# Patient Record
Sex: Female | Born: 2000 | Hispanic: Refuse to answer | Marital: Single | Smoking: Never smoker
Health system: Southern US, Community
[De-identification: ages and names within clinical notes are randomized; demographics above are authoritative.]

---
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
## 2019-01-01 ENCOUNTER — Ambulatory Visit (INDEPENDENT_AMBULATORY_CARE_PROVIDER_SITE_OTHER): Payer: BC Managed Care – PPO | Admitting: Family Medicine

## 2019-01-01 ENCOUNTER — Other Ambulatory Visit: Payer: Self-pay

## 2019-01-01 DIAGNOSIS — M25562 Pain in left knee: Secondary | ICD-10-CM | POA: Diagnosis not present

## 2019-01-02 LAB — URIC ACID: Uric Acid: 3.4 mg/dL (ref 2.5–7.1)

## 2019-01-02 LAB — SEDIMENTATION RATE: Sed Rate: 16 mm/hr (ref 0–32)

## 2019-01-02 LAB — RHEUMATOID FACTOR: Rheumatoid fact SerPl-aCnc: 21.4 IU/mL — ABNORMAL HIGH (ref 0.0–13.9)

## 2019-01-06 ENCOUNTER — Other Ambulatory Visit: Payer: Self-pay | Admitting: Family Medicine

## 2019-01-06 DIAGNOSIS — M25562 Pain in left knee: Secondary | ICD-10-CM

## 2019-01-14 NOTE — Progress Notes (Signed)
Patient presents for follow-up for left knee pain. She states her knee pain began after catching during softball practice. Patient has had a Xray and MRI. Reviewed results for both with patient. Naprosyn has seemed to help some. Her ROM has improved some since initial visit. Denies any new symptoms such as fever, rash, or other joint pain.   ROS: Negative except mentioned above. Vitals as per Epic.  GENERAL: NAD MSK:L Knee - no significant effusion, no warmth or erythema appreciated, moderate tenderness to palpation of the knee (general), has anterior discomfort of the knee with any manipulation, nv intact  NEURO: CN II-XII grossly intact   A/P: L Knee Pain - hypersensitive to touch, unsure if symptoms related to medial patella plica or Hoffa's fat pad noted on MRI, will do some labs, f/u with Dr. Gerald Dexter when he is on campus, continue Naprosyn for now, seek medical attention if any change in symptoms or worsening symptoms. Patient and trainer addresses understanding.    Addendum - nl uric acid and ESR but slightly elevated RF, will refer to Rheumatology.

## 2019-01-29 NOTE — Progress Notes (Signed)
Office Visit Note  Patient: Katie Jacobs             Date of Birth: 06/22/2000           MRN: [id number]             PCP: Katie ProvostPatel, Kirtida, MD Referring: Katie ProvostPatel, Kirtida, MD Visit Date: 02/12/2019 Occupation: [name], [name]  Subjective:  Pain in left knee.   History of Present Illness: Katie Jacobs is a 18 y.o. female seen in consultation per request of Dr. Allena Jacobs.  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  She states on December 14, 2018 she was placed in the catcher position which is unusual for her.  She states after the game her left knee started hurting.  She went to the trainer who initially thought it was IT band issue.  Next week she states that after sprinting she started having severe pain in her knee joint to the point she could not run anymore.  She went to her PCP at [facility] [employer] [employer] who placed her on Naprosyn.  Her knee joint was also very sensitive to touch.  At the time some labs were obtained and her rheumatoid factor was positive.  She was seen by sports medicine doctor who did MRI which according to patient was normal.  She was placed on Indocin for pain management.  [facility] who felt that her plica was thickened and advised to rest.  She was also given a cortisone shot about 4 weeks ago.  She states that gradually her symptoms are started improving.  She had use crutches for 2 months now.  She has been off crutches for 5 days now.  She is a still unable to walk long distance.  She states at the time no arthroscopic surgery was advised.  She was also diagnosed with complex regional pain syndrome and was placed on Elavil which has been helping her to some extent.  None of the other joints are painful.  She has no family history of autoimmune disease.  Activities of Daily Living:  Patient reports morning stiffness for 0 minutes.   Patient Denies nocturnal pain.  Difficulty dressing/grooming: Denies Difficulty climbing  stairs: Reports Difficulty getting out of chair: Denies Difficulty using hands for taps, buttons, cutlery, and/or writing: Denies  Review of Systems  Constitutional: Negative for fatigue, night sweats, weight gain and weight loss.  HENT: Negative for mouth sores, trouble swallowing, trouble swallowing, mouth dryness and nose dryness.   Eyes: Negative for pain, redness, itching, visual disturbance and dryness.  Respiratory: Negative for cough, shortness of breath, wheezing and difficulty breathing.   Cardiovascular: Negative for chest pain, palpitations, hypertension, irregular heartbeat and swelling in legs/feet.  Gastrointestinal: Negative for abdominal pain, blood in stool, constipation and diarrhea.  Endocrine: Negative for increased urination.  Genitourinary: Negative for difficulty urinating, painful urination and vaginal dryness.  Musculoskeletal: Positive for gait problem. Negative for arthralgias, joint pain, joint swelling, myalgias, muscle weakness, morning stiffness, muscle tenderness and myalgias.  Skin: Negative for color change, rash, hair loss, skin tightness, ulcers and sensitivity to sunlight.  Allergic/Immunologic: Negative for susceptible to infections.  Neurological: Negative for dizziness, light-headedness, numbness, headaches, memory loss, night sweats and weakness.  Hematological: Negative for bruising/bleeding tendency and swollen glands.  Psychiatric/Behavioral: Negative for depressed mood, confusion and sleep disturbance. The patient is not nervous/anxious.     PMFS History:  There are no active problems to display for this  patient.   History reviewed. No pertinent past medical history.  Family History  Problem Relation Age of Onset  . Healthy Mother   . Healthy Father   . Healthy Brother    Past Surgical History:  Procedure Laterality Date  . WISDOM TOOTH EXTRACTION  2014   Social History   Social History Narrative  . Not on file    There is no  immunization history on file for this patient.   Objective: Vital Signs: BP 133/89 (BP Location: Right Arm, Patient Position: Sitting, Cuff Size: Normal)   Pulse 94   Resp 12   Ht 5\' 10"  (1.778 m)   Wt 176 lb 9.6 oz (80.1 kg)   BMI 25.34 kg/m    Physical Exam Vitals signs and nursing note reviewed.  Constitutional:      Appearance: She is well-developed.  HENT:     Head: Normocephalic and atraumatic.  Eyes:     Conjunctiva/sclera: Conjunctivae normal.  Neck:     Musculoskeletal: Normal range of motion.  Cardiovascular:     Rate and Rhythm: Normal rate and regular rhythm.     Heart sounds: Normal heart sounds.  Pulmonary:     Effort: Pulmonary effort is normal.     Breath sounds: Normal breath sounds.  Abdominal:     General: Bowel sounds are normal.     Palpations: Abdomen is soft.  Lymphadenopathy:     Cervical: No cervical adenopathy.  Skin:    General: Skin is warm and dry.     Capillary Refill: Capillary refill takes less than 2 seconds.  Neurological:     Mental Status: She is alert and oriented to person, place, and time.  Psychiatric:        Behavior: Behavior normal.      Musculoskeletal Exam: C-spine thoracic and lumbar spine were in good range of motion.  She had no SI joint tenderness penis.  Shoulder joints, elbow joints,  MCPs PIPs and DIPs with good range of motion with no synovitis.  She has limited range of motion of her right wrist joint with no tenderness on palpation.  She relates it to previous injury about 4 years ago.  Hip joints, knee joints, ankle joints, MTPs PIPs with good range of motion.  She had no warmth or swelling in her left knee joint today.  CDAI Exam: CDAI Score: - Patient Global: -; Provider Global: - Swollen: -; Tender: - Joint Exam   No joint exam has been documented for this visit   There is currently no information documented on the homunculus. Go to the Rheumatology activity and complete the homunculus joint exam.   Investigation: No additional findings. Component     Latest Ref Rng & Units 01/01/2019  Sed Rate     0 - 32 mm/hr 16  Uric Acid     2.5 - 7.1 mg/dL 3.4  RA Latex Turbid.     0.0 - 13.9 IU/mL 21.4 (H)   Imaging: No results found.  Recent Labs: No results found for: WBC, HGB, PLT, NA, K, CL, CO2, GLUCOSE, BUN, CREATININE, BILITOT, ALKPHOS, AST, ALT, PROT, ALBUMIN, CALCIUM, GFRAA, QFTBGOLD, QFTBGOLDPLUS  Speciality Comments: No specialty comments available.  Procedures:  No procedures performed Allergies: Patient has no known allergies.   Assessment / Plan:     Visit Diagnoses: Chronic pain of left knee -she has been having increased pain and discomfort in her left knee joint since the last softball game played on December 14, 2018 in the  catcher position at [employer].  She had extensive work-up including x-ray and MRI. XR unremarkable. MRI thickened medial patellar plica.  She has been evaluated by orthopedic surgeon and the sports medicine doctors.  Her labs initially showed elevated rheumatoid factor which is low titer.  None of the other joints are painful or swollen.  She had a cortisone injection to her left knee joint about 4 weeks ago with improvement in her symptoms.  Her symptoms are not completely resolved.  She still has discomfort and limps when she walks.  I have advised her to take anti-inflammatories on a regular basis for the next 2 months.  If she still has persistent symptoms then we may explore this further with obtaining more labs including anti-CCP and 14 3 3  eta.  At this point her symptoms are improving and I would like to observe.  There is no family history of autoimmune disease.  None of the other joints are painful.  She was in agreement.  01/01/19: sed rate 16, uric acid 3.4, RF 21.4  Complex regional pain syndrome-patient states she was having localized hypersensitivity to touch.  She was placed on Elavil which relieved her symptoms.  Wrist injury, right  sequela-patient reports that about 4 years ago she injured her right wrist while playing sports.  She has limited range of motion since then.  She had no warmth or swelling.  She states she has off-and-on discomfort in her wrist.  Orders: No orders of the defined types were placed in this encounter.  No orders of the defined types were placed in this encounter.     Follow-Up Instructions: Return in about 3 months (around 05/15/2019).   Bo Merino, MD  Note - This record has been created using Editor, commissioning.  Chart creation errors have been sought, but may not always  have been located. Such creation errors do not reflect on  the standard of medical care.

## 2019-02-12 ENCOUNTER — Encounter: Payer: Self-pay | Admitting: Rheumatology

## 2019-02-12 ENCOUNTER — Ambulatory Visit (INDEPENDENT_AMBULATORY_CARE_PROVIDER_SITE_OTHER): Payer: PRIVATE HEALTH INSURANCE | Admitting: Rheumatology

## 2019-02-12 ENCOUNTER — Other Ambulatory Visit: Payer: Self-pay

## 2019-02-12 VITALS — BP 133/89 | HR 94 | Resp 12 | Ht 70.0 in | Wt 176.6 lb

## 2019-02-12 DIAGNOSIS — G8929 Other chronic pain: Secondary | ICD-10-CM

## 2019-02-12 DIAGNOSIS — S6991XS Unspecified injury of right wrist, hand and finger(s), sequela: Secondary | ICD-10-CM

## 2019-02-12 DIAGNOSIS — M25562 Pain in left knee: Secondary | ICD-10-CM

## 2019-02-12 DIAGNOSIS — G90522 Complex regional pain syndrome I of left lower limb: Secondary | ICD-10-CM

## 2019-07-08 ENCOUNTER — Other Ambulatory Visit: Payer: Self-pay

## 2019-07-08 ENCOUNTER — Ambulatory Visit: Payer: BC Managed Care – PPO | Admitting: Family

## 2019-07-08 DIAGNOSIS — S61212A Laceration without foreign body of right middle finger without damage to nail, initial encounter: Secondary | ICD-10-CM

## 2019-07-08 DIAGNOSIS — Z4802 Encounter for removal of sutures: Secondary | ICD-10-CM

## 2019-07-08 NOTE — Progress Notes (Signed)
   Acute Office Visit  Subjective:    Patient ID: Katie Jacobs, female    DOB: September 14, 2000, 19 y.o.   MRN: [id number]  CC:  Suture removal  HPI Patient is in today for suture removal. She had 2 sutures placed to right 3rd mid finger 6 days ago after cutting hand on metal part of gate at softball practice.  Pt had sutures placed at [facility].  Pt was told her teatnus status was utd.  No past medical history on file.  No Known Allergies     Objective:    Physical Exam Skin:         There were no vitals taken for this visit.      Assessment & Plan:   Removed 2 sutures without difficulty.  Placed steri strips over with Benzoin.  Reviewed signs and symptoms of concerns to follow up for.  Pt will slowly return to play.   No orders of the defined types were placed in this encounter.    Colt Martelle, Deirdre Peer, NP

## 2020-08-30 IMAGING — MR MR KNEE*L* W/O CM
7 series · 40 of 40 positions shown · non-contrast
Comparison: None.

CLINICAL DATA: Decreased range of motion, popping. Status post
softball injury 2 weeks ago.

EXAM:
MRI OF THE LEFT KNEE WITHOUT CONTRAST
TECHNIQUE: Multiplanar, multisequence MR imaging of the knee was performed. No
intravenous contrast was administered.

[Series 8: T2 fat-sat · axial · left · 4.0mm · 0.50mm/px · z∈[-88,+35]mm · 6 of 26 slices shown (1 of 3)]
[im 1/26]
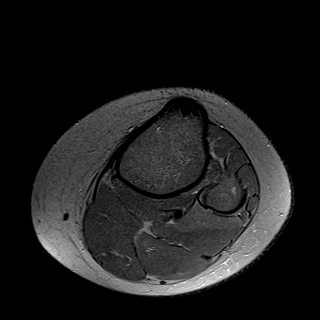
[im 6/26]
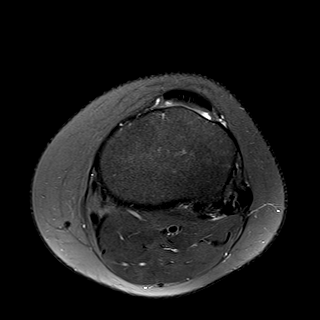
[im 11/26]
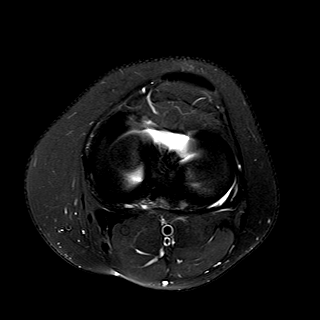
[im 16/26]
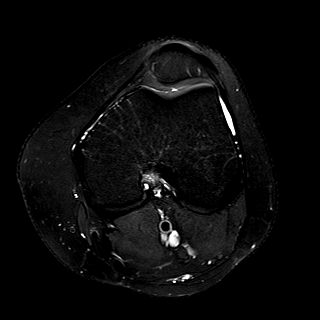
[im 21/26]
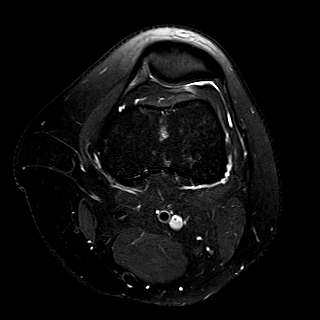
[im 26/26]
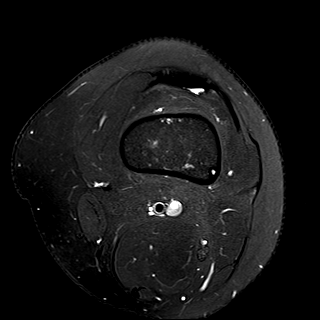

[Series 9: T2 fat-sat · coronal · left · 4.0mm · 0.59mm/px · 6 of 29 slices shown (2 of 3)]
[im 1/29]
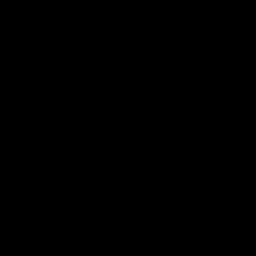
[im 6/29]
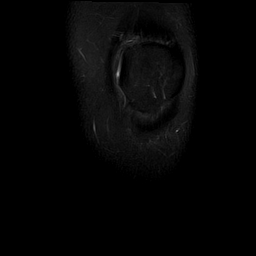
[im 12/29]
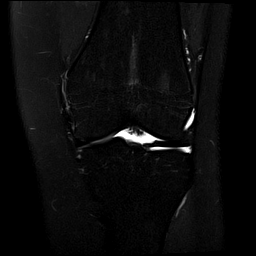
[im 17/29]
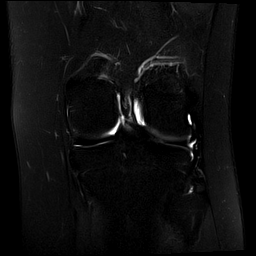
[im 23/29]
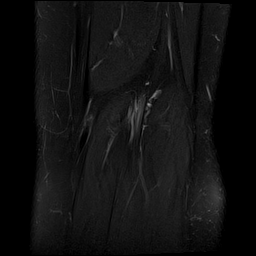
[im 29/29]
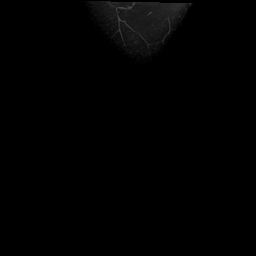

[Series 10: T1 · coronal · left · 4.0mm · 0.59mm/px · 6 of 30 slices shown]
[im 1/30]
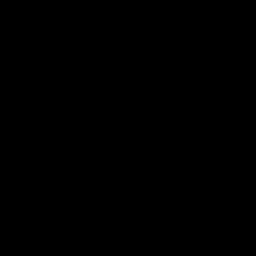
[im 6/30]
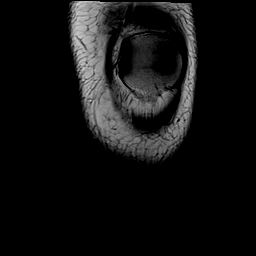
[im 12/30]
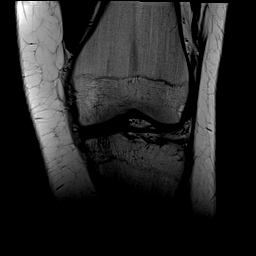
[im 18/30]
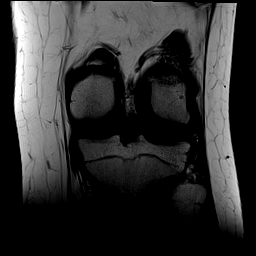
[im 24/30]
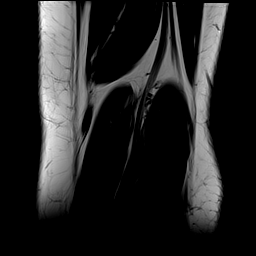
[im 30/30]
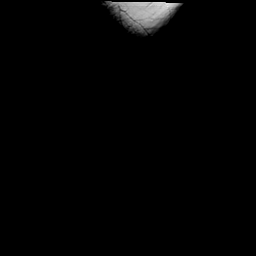

[Series 11: PD fat-sat · coronal · left · 4.0mm · 0.59mm/px · 6 of 30 slices shown (1 of 2)]
[im 1/30]
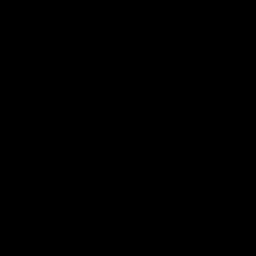
[im 6/30]
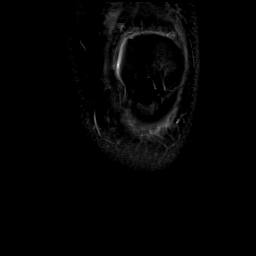
[im 12/30]
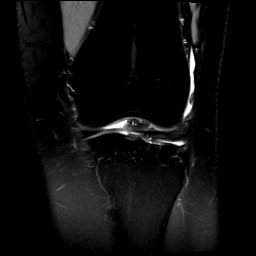
[im 18/30]
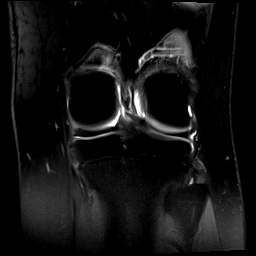
[im 24/30]
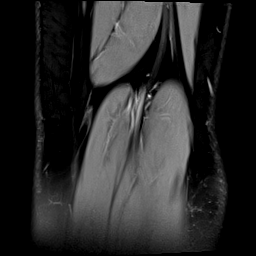
[im 30/30]
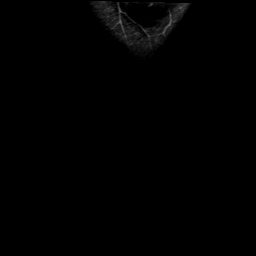

[Series 12: PD fat-sat · sagittal · left · 3.0mm · 0.59mm/px · 7 of 33 slices shown (2 of 2)]
[im 1/33]
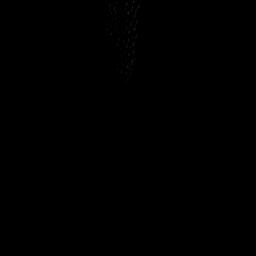
[im 6/33]
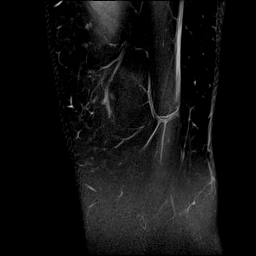
[im 11/33]
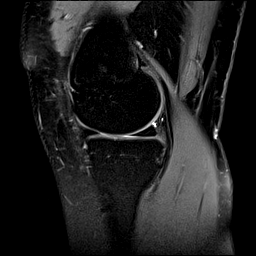
[im 17/33]
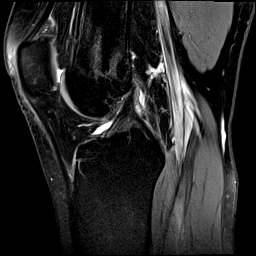
[im 22/33]
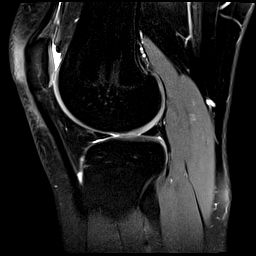
[im 27/33]
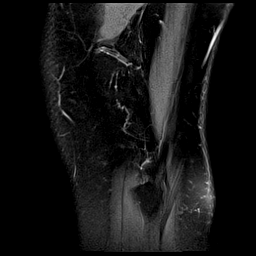
[im 33/33]
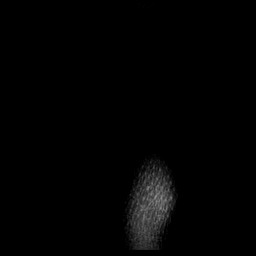

[Series 13: T2 fat-sat · sagittal · left · 3.0mm · 0.59mm/px · 7 of 36 slices shown (3 of 3)]
[im 1/36]
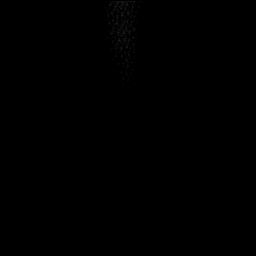
[im 6/36]
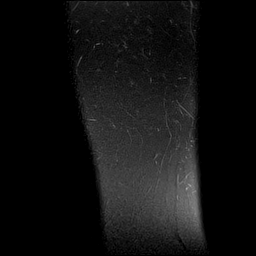
[im 12/36]
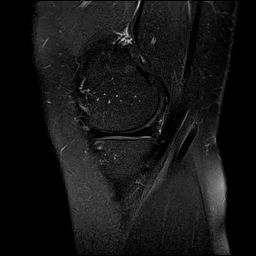
[im 18/36]
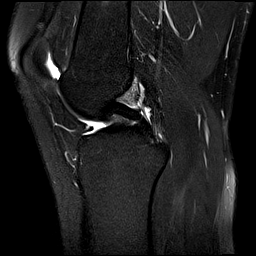
[im 24/36]
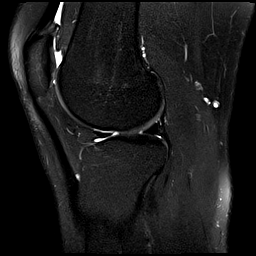
[im 30/36]
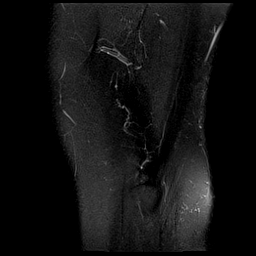
[im 36/36]
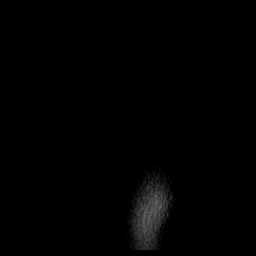

[Series 14: PD · coronal · left · 2.0mm · 0.47mm/px · 2 of 10 slices shown]
[im 1/10]
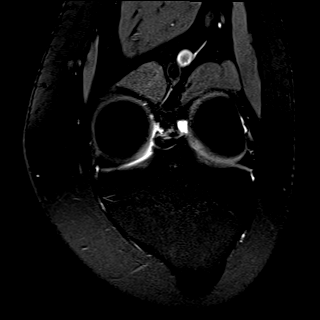
[im 10/10]
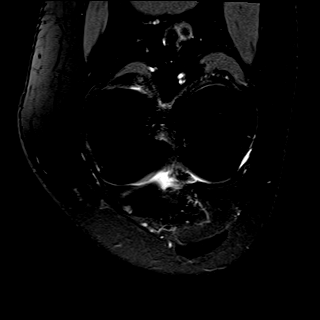

[40 of 40 positions shown; findings below may reference images not displayed]

FINDINGS: MENISCI

Medial meniscus:  Intact.

Lateral meniscus:  Intact.

LIGAMENTS

Cruciates:  Intact ACL and PCL.

Collaterals: Medial collateral ligament is intact. Lateral
collateral ligament complex is intact.

CARTILAGE

Patellofemoral:  No chondral defect.

Medial:  No chondral defect.

Lateral:  No chondral defect.

Joint: No joint effusion. Minimal edema in Hoffa's fat. Thickened
medial patellar plica.

Popliteal Fossa:  No Baker cyst. Intact popliteus tendon.

Extensor Mechanism: Intact quadriceps tendon. Intact patellar
tendon. Intact medial patellar retinaculum. Intact lateral patellar
retinaculum. Intact MPFL.

Bones:  No acute osseous abnormality.  No aggressive osseous lesion.

Other: No fluid collection or hematoma.
IMPRESSION: 1. No meniscal or ligamentous injury of the left knee.
2. Thickened medial patellar plica.
